# Patient Record
Sex: Female | Born: 2000 | Race: White | Hispanic: No | Marital: Single | Smoking: Never smoker
Health system: Southern US, Community
[De-identification: ages and names within clinical notes are randomized; demographics above are authoritative.]

## PROBLEM LIST (undated history)

## (undated) DIAGNOSIS — L509 Urticaria, unspecified: Secondary | ICD-10-CM

## (undated) DIAGNOSIS — T783XXA Angioneurotic edema, initial encounter: Secondary | ICD-10-CM

## (undated) DIAGNOSIS — K589 Irritable bowel syndrome without diarrhea: Secondary | ICD-10-CM

## (undated) DIAGNOSIS — Z464 Encounter for fitting and adjustment of orthodontic device: Secondary | ICD-10-CM

## (undated) DIAGNOSIS — F429 Obsessive-compulsive disorder, unspecified: Secondary | ICD-10-CM

## (undated) DIAGNOSIS — IMO0001 Reserved for inherently not codable concepts without codable children: Secondary | ICD-10-CM

## (undated) DIAGNOSIS — J45909 Unspecified asthma, uncomplicated: Secondary | ICD-10-CM

## (undated) DIAGNOSIS — H547 Unspecified visual loss: Secondary | ICD-10-CM

## (undated) HISTORY — DX: Unspecified asthma, uncomplicated: J45.909

## (undated) HISTORY — DX: Angioneurotic edema, initial encounter: T78.3XXA

## (undated) HISTORY — DX: Unspecified visual loss: H54.7

## (undated) HISTORY — DX: Reserved for inherently not codable concepts without codable children: IMO0001

## (undated) HISTORY — DX: Encounter for fitting and adjustment of orthodontic device: Z46.4

## (undated) HISTORY — DX: Obsessive-compulsive disorder, unspecified: F42.9

## (undated) HISTORY — DX: Irritable bowel syndrome, unspecified: K58.9

## (undated) HISTORY — DX: Urticaria, unspecified: L50.9

---
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
## 2022-01-02 ENCOUNTER — Telehealth: Payer: Self-pay | Admitting: Adult Health

## 2022-01-02 NOTE — Telephone Encounter (Signed)
Can u print this off and put in my box

## 2022-01-02 NOTE — Telephone Encounter (Signed)
Kristine Donaldson called and said that she had a meeting with DSS rep and they need a couple more things added as an addendum to the letter completed on 12/21/21.   1) Description of more benefits of an emotional support animal.   2) Outline consequences of not being able to get these accomodation's and what would happen mentally if she didn't get the emotional support animal.  Her mom will pick the letter up. Kristine Donaldson's phone number is [phone].

## 2022-01-24 ENCOUNTER — Telehealth: Payer: Self-pay | Admitting: Adult Health

## 2022-01-24 NOTE — Telephone Encounter (Signed)
Spoke with mother.

## 2022-01-24 NOTE — Telephone Encounter (Signed)
Pt's mom Eustaquio Maize called needing to talk to provider about specific details needed in letter form to [place]. She received a letter but school needs more info. Call Beth @ [phone] ASAP

## 2022-01-25 ENCOUNTER — Telehealth: Payer: Self-pay | Admitting: Adult Health

## 2022-01-25 NOTE — Telephone Encounter (Signed)
Traci had called her.

## 2022-01-25 NOTE — Telephone Encounter (Signed)
Patient said that the representative she is dealing with said the content of your letter was fine, but needs to emphasize the limitations and consequences and that these are needs and not wants. I'll put the paperwork back in your box.  ?

## 2022-01-25 NOTE — Telephone Encounter (Signed)
Pt LVM just missed Gina's call. Said she will try to keep phone near her for return call or let he know when a good time to call back to speak to [place]. ?

## 2022-01-27 ENCOUNTER — Telehealth: Payer: Self-pay | Admitting: Adult Health

## 2022-01-27 NOTE — Telephone Encounter (Signed)
Pt called to advise letter for school is ready and Beth (mom) will pick up today.  ?

## 2022-05-18 ENCOUNTER — Ambulatory Visit (INDEPENDENT_AMBULATORY_CARE_PROVIDER_SITE_OTHER): Payer: BC Managed Care – PPO | Admitting: Psychiatry

## 2022-05-18 DIAGNOSIS — F422 Mixed obsessional thoughts and acts: Secondary | ICD-10-CM | POA: Diagnosis not present
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
## 2022-05-23 ENCOUNTER — Ambulatory Visit: Payer: BC Managed Care – PPO | Admitting: Psychiatry

## 2022-06-05 ENCOUNTER — Ambulatory Visit (INDEPENDENT_AMBULATORY_CARE_PROVIDER_SITE_OTHER): Payer: BC Managed Care – PPO | Admitting: Adult Health

## 2022-06-05 ENCOUNTER — Encounter: Payer: Self-pay | Admitting: Adult Health

## 2022-06-05 DIAGNOSIS — F422 Mixed obsessional thoughts and acts: Secondary | ICD-10-CM

## 2022-06-05 DIAGNOSIS — F411 Generalized anxiety disorder: Secondary | ICD-10-CM | POA: Diagnosis not present

## 2022-06-05 DIAGNOSIS — R4588 Nonsuicidal self-harm: Secondary | ICD-10-CM

## 2022-06-05 NOTE — Progress Notes (Signed)
[name] [id number] November 23, 2001 21 y.o.  Subjective:   Patient ID:  Kristine Donaldson is a 21 y.o. (DOB Apr 13, 2001) female.  Chief Complaint: No chief complaint on file.   HPI Kristine Donaldson presents to the office today for follow-up of obsessional thoughts and acts, anxiety and non suicidal self harm.  Describes mood today as "ok". Pleasant. Decreased tearfulness. Mood symptoms - reports some depression, anxiety and irritability. Mood is consistent. Stating "I feel fine". Has completed her first year at [employer] and is home for the summer. Feels like she has adjusted well to college - made good friends. Feels like medications continues to work well. Stable interest and motivation. Taking medications as prescribed.  Energy levels stable. Active, has a regular exercise routine.  Enjoys some usual interests and activities. Single. Student. Lives with parents when not in college - youngest of 4 children .Spending time with family. Appetite adequate. Weight stable. Sleeps better some nights than others. Averages 6 to 8 hours. Focus and concentration stable - "pretty good". Completing tasks. Managing aspects of household. Attending [employer] rising sophomore. Denies SI or HI.  Denies AH or VH. Reports cutting once a few weeks ago - none for months.  Smokes THC daily.  Previous medication trials: Denies       Review of Systems:  Review of Systems  Musculoskeletal:  Negative for gait problem.  Neurological:  Negative for tremors.  Psychiatric/Behavioral:         Please refer to HPI    Medications: I have reviewed the patient's current medications.  Current Outpatient Medications  Medication Sig Dispense Refill   cetirizine (ZYRTEC) 10 MG tablet Take 10 mg by mouth daily.     clindamycin (CLEOCIN) 150 MG capsule Take 300 mg by mouth every 8 (eight) hours.     dicyclomine (BENTYL) 20 MG tablet      fluvoxaMINE (LUVOX) 100 MG tablet Take 2 tablets (200 mg total) by mouth at bedtime. 180 tablet 3    fluvoxaMINE (LUVOX) 25 MG tablet Take 1 tablet (25 mg total) by mouth at bedtime. 90 tablet 3   montelukast (SINGULAIR) 10 MG tablet Take 1 tablet (10 mg total) by mouth at bedtime. 30 tablet 5   propranolol (INDERAL) 10 MG tablet Take 1 tablet (10 mg total) by mouth 2 (two) times daily. 180 tablet 3   No current facility-administered medications for this visit.    Medication Side Effects: None  Allergies:  Allergies  Allergen Reactions   Penicillin G     Other reaction(s): Unknown   Penicillins Rash    Past Medical History:  Diagnosis Date   Angio-edema    Asthma    history   Irritable bowel syndrome    Obsessive-compulsive disorder    Orthodontics    Urticaria    Vision impairment     Past Medical History, Surgical history, Social history, and Family history were reviewed and updated as appropriate.   Please see review of systems for further details on the patient's review from today.   Objective:   Physical Exam:  There were no vitals taken for this visit.  Physical Exam Constitutional:      General: She is not in acute distress. Musculoskeletal:        General: No deformity.  Neurological:     Mental Status: She is alert and oriented to person, place, and time.     Coordination: Coordination normal.  Psychiatric:        Attention and Perception: Attention and perception  normal. She does not perceive auditory or visual hallucinations.        Mood and Affect: Mood normal. Mood is not anxious or depressed. Affect is not labile, blunt, angry or inappropriate.        Speech: Speech normal.        Behavior: Behavior normal.        Thought Content: Thought content normal. Thought content is not paranoid or delusional. Thought content does not include homicidal or suicidal ideation. Thought content does not include homicidal or suicidal plan.        Cognition and Memory: Cognition and memory normal.        Judgment: Judgment normal.     Comments: Insight intact      Lab Review:     Component Value Date/Time   NA 136 03/13/2020 1244   K 4.1 03/13/2020 1244   CL 109 03/13/2020 1244   CO2 20 (L) 03/13/2020 1244   GLUCOSE 89 03/13/2020 1244   BUN 9 03/13/2020 1244   CREATININE 0.71 03/13/2020 1244   CALCIUM 8.7 (L) 03/13/2020 1244   PROT 6.7 03/13/2020 1244   ALBUMIN 3.1 (L) 03/13/2020 1244   AST 16 03/13/2020 1244   ALT 11 03/13/2020 1244   ALKPHOS 59 03/13/2020 1244   BILITOT 0.5 03/13/2020 1244   GFRNONAA NOT CALCULATED 03/13/2020 1244   GFRAA NOT CALCULATED 03/13/2020 1244       Component Value Date/Time   WBC 6.5 03/13/2020 1244   RBC 4.14 03/13/2020 1244   HGB 12.8 03/13/2020 1244   HCT 38.5 03/13/2020 1244   PLT 237 03/13/2020 1244   MCV 93.0 03/13/2020 1244   MCH 30.9 03/13/2020 1244   MCHC 33.2 03/13/2020 1244   RDW 12.0 03/13/2020 1244   LYMPHSABS 1.4 03/13/2020 1244   MONOABS 0.4 03/13/2020 1244   EOSABS 0.1 03/13/2020 1244   BASOSABS 0.0 03/13/2020 1244    No results found for: "POCLITH", "LITHIUM"   No results found for: "PHENYTOIN", "PHENOBARB", "VALPROATE", "CBMZ"   .res Assessment: Plan:    Plan:  PDMP reviewed  1. D/C Luvox '100mg'$  - 2 daily 2. D/C Luvox '25mg'$  to daily regimen 3. Propranolol '10mg'$  BID - plans to use as needed  Set up with therapist Lina Sayre  RTC 6 months - will call if needs medications.  Patient advised to contact office with any questions, adverse effects, or acute worsening in signs and symptoms.  Diagnoses and all orders for this visit:  Mixed obsessional thoughts and acts  Generalized anxiety disorder  Non-suicidal self-harm ([place])     Please see After Visit Summary for patient specific instructions.  Future Appointments  Date Time Provider [place]  06/08/2022  3:00 PM Lina Sayre, [facility] CP-CP None    No orders of the defined types were placed in this encounter.   -------------------------------

## 2022-06-08 ENCOUNTER — Ambulatory Visit (INDEPENDENT_AMBULATORY_CARE_PROVIDER_SITE_OTHER): Payer: BC Managed Care – PPO | Admitting: Psychiatry

## 2022-06-08 DIAGNOSIS — F422 Mixed obsessional thoughts and acts: Secondary | ICD-10-CM
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
## 2022-09-25 ENCOUNTER — Telehealth: Payer: Self-pay | Admitting: Psychiatry

## 2022-09-25 NOTE — Telephone Encounter (Signed)
Tried to call patient due to not being able to get her in for an appointment.  No answer and mailbox was full.

## 2022-11-13 ENCOUNTER — Ambulatory Visit: Payer: BC Managed Care – PPO | Admitting: Psychiatry

## 2022-11-21 ENCOUNTER — Encounter: Payer: Self-pay | Admitting: Plastic Surgery

## 2022-11-21 ENCOUNTER — Ambulatory Visit (INDEPENDENT_AMBULATORY_CARE_PROVIDER_SITE_OTHER): Payer: BC Managed Care – PPO | Admitting: Plastic Surgery

## 2022-11-21 DIAGNOSIS — N62 Hypertrophy of breast: Secondary | ICD-10-CM | POA: Diagnosis not present

## 2022-11-21 NOTE — Progress Notes (Signed)
   Subjective:    Patient ID: Kristine Donaldson, female    DOB: 12/27/2000, 21 y.o.   MRN: [id number]  The patient is a 21 year old female joining me by phone with her mom.  She is interested in a bilateral breast reduction.  However she is in college and does not think that she can get time off within the next 90 days.      Review of Systems  Constitutional: Negative.   Eyes: Negative.   Respiratory: Negative.    Cardiovascular: Negative.   Gastrointestinal: Negative.   Endocrine: Negative.   Genitourinary: Negative.        Objective:   Physical Exam     Assessment & Plan:     ICD-10-CM   1. Juvenile mammary hypertrophy  N62        Patient will call for submission to insurance when she has an idea of when she could have the surgery amidst her schooling.  I connected with  Kristine Donaldson on 11/21/22 by phone and verified that I am speaking with the correct person using two identifiers. Patient was at home and I was at the office.  We spent 5 minutes in discussion.   I discussed the limitations of evaluation and management by telemedicine. The patient expressed understanding and agreed to proceed.

## 2023-01-19 ENCOUNTER — Other Ambulatory Visit: Payer: Self-pay | Admitting: Adult Health

## 2023-01-19 DIAGNOSIS — F422 Mixed obsessional thoughts and acts: Secondary | ICD-10-CM

## 2023-01-21 NOTE — Telephone Encounter (Signed)
Please call to schedule an appt, was due in January.

## 2023-01-26 NOTE — Telephone Encounter (Signed)
Vm full

## 2023-02-20 ENCOUNTER — Telehealth: Payer: Self-pay | Admitting: Adult Health

## 2023-02-20 NOTE — Telephone Encounter (Signed)
We need more details on this.

## 2023-02-20 NOTE — Telephone Encounter (Signed)
Pt called and said that she is still looking for a psychatrist in [place]. She was recently in the hospital and the doctor increased her luvox to 50 mg. Please [facility] in [facility] [place], [place] [address]. Please call her if this can't be done at [phone]

## 2023-02-21 NOTE — Telephone Encounter (Signed)
Mailbox is full, does not have MyChart.

## 2023-02-26 NOTE — Telephone Encounter (Signed)
Called again and mailbox is full

## 2023-04-11 ENCOUNTER — Encounter: Payer: Self-pay | Admitting: Psychiatry

## 2023-04-11 ENCOUNTER — Ambulatory Visit (INDEPENDENT_AMBULATORY_CARE_PROVIDER_SITE_OTHER): Payer: BC Managed Care – PPO | Admitting: Psychiatry

## 2023-04-11 DIAGNOSIS — F422 Mixed obsessional thoughts and acts: Secondary | ICD-10-CM
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
## 2023-05-11 ENCOUNTER — Ambulatory Visit (INDEPENDENT_AMBULATORY_CARE_PROVIDER_SITE_OTHER): Payer: BC Managed Care – PPO | Admitting: Adult Health

## 2023-05-11 ENCOUNTER — Encounter: Payer: Self-pay | Admitting: Adult Health

## 2023-05-11 DIAGNOSIS — F422 Mixed obsessional thoughts and acts: Secondary | ICD-10-CM

## 2023-05-11 DIAGNOSIS — R4588 Nonsuicidal self-harm: Secondary | ICD-10-CM

## 2023-05-11 DIAGNOSIS — F411 Generalized anxiety disorder: Secondary | ICD-10-CM | POA: Diagnosis not present

## 2023-05-11 NOTE — Progress Notes (Signed)
Kristine Donaldson [id number] [birth year] 22 y.o.  Subjective:   Patient ID:  Kristine Donaldson is a 22 y.o. (DOB 24-Mar-2001) female.  Chief Complaint: No chief complaint on file.   HPI Kristine Donaldson presents to the office today for follow-up of obsessional thoughts and acts, anxiety and non suicidal self harm.  Describes mood today as "ok". Pleasant. Decreased tearfulness. Mood symptoms - denies depression and irritability. Reports anxiety at times. Mood is consistent. Stating "I feel like I'm doing alright for now". Reports she stopped medication for a period of time and restarted at 25mg  daily. Was then hospitalized in February for suicidal thoughts. Reports taking Luvox 100mg  daily consistently. Patient also wanting to discuss a BPD diagnosis. Discussed cutting history - started at age 28. Plans to continue therapy - seek DBT. Has met with Stevphen Meuse recently. Has completed her second year at [employer] and is home for the summer. Stable interest and motivation. Taking medications as prescribed.  Energy levels stable. Active, has a regular exercise routine.  Enjoys some usual interests and activities. Single. Student. Lives with parents when not in college - youngest of 4 children .Spending time with family. Appetite adequate. Weight stable. Sleeps better some nights than others. Averages 8 to 9 hours of broken sleep. Focus and concentration stable - Completing tasks. Managing aspects of household. 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  Denies AH or VH. Reports cutting - 2 weeks ago. Smokes THC daily.  Previous medication trials: Denies   Review of Systems:  Review of Systems  Musculoskeletal:  Negative for gait problem.  Neurological:  Negative for tremors.  Psychiatric/Behavioral:         Please refer to HPI    Medications: I have reviewed the patient's current medications.  Current Outpatient Medications  Medication Sig Dispense Refill   cetirizine (ZYRTEC) 10 MG tablet Take  10 mg by mouth daily.     clindamycin (CLEOCIN) 150 MG capsule Take 300 mg by mouth every 8 (eight) hours.     dicyclomine (BENTYL) 20 MG tablet      fluvoxaMINE (LUVOX) 100 MG tablet Take 2 tablets (200 mg total) by mouth at bedtime. 180 tablet 3   fluvoxaMINE (LUVOX) 25 MG tablet Take 1 tablet (25 mg total) by mouth at bedtime. 90 tablet 3   montelukast (SINGULAIR) 10 MG tablet Take 1 tablet (10 mg total) by mouth at bedtime. 30 tablet 5   propranolol (INDERAL) 10 MG tablet Take 1 tablet (10 mg total) by mouth 2 (two) times daily. 180 tablet 3   No current facility-administered medications for this visit.    Medication Side Effects: None  Allergies:  Allergies  Allergen Reactions   Penicillin G     Other reaction(s): Unknown Other reaction(s): Unknown   Penicillins Rash    Past Medical History:  Diagnosis Date   Angio-edema    Asthma    history   Irritable bowel syndrome    Obsessive-compulsive disorder    Orthodontics    Urticaria    Vision impairment     Past Medical History, Surgical history, Social history, and Family history were reviewed and updated as appropriate.   Please see review of systems for further details on the patient's review from today.   Objective:   Physical Exam:  There were no vitals taken for this visit.  Physical Exam Constitutional:      General: She is not in acute distress. Musculoskeletal:        General:  No deformity.  Neurological:     Mental Status: She is alert and oriented to person, place, and time.     Coordination: Coordination normal.  Psychiatric:        Attention and Perception: Attention and perception normal. She does not perceive auditory or visual hallucinations.        Mood and Affect: Mood normal. Mood is not anxious or depressed. Affect is not labile, blunt, angry or inappropriate.        Speech: Speech normal.        Behavior: Behavior normal.        Thought Content: Thought content normal. Thought content is  not paranoid or delusional. Thought content does not include homicidal or suicidal ideation. Thought content does not include homicidal or suicidal plan.        Cognition and Memory: Cognition and memory normal.        Judgment: Judgment normal.     Comments: Insight intact     Lab Review:     Component Value Date/Time   NA 136 03/13/2020 1244   K 4.1 03/13/2020 1244   CL 109 03/13/2020 1244   CO2 20 (L) 03/13/2020 1244   GLUCOSE 89 03/13/2020 1244   BUN 9 03/13/2020 1244   CREATININE 0.71 03/13/2020 1244   CALCIUM 8.7 (L) 03/13/2020 1244   PROT 6.7 03/13/2020 1244   ALBUMIN 3.1 (L) 03/13/2020 1244   AST 16 03/13/2020 1244   ALT 11 03/13/2020 1244   ALKPHOS 59 03/13/2020 1244   BILITOT 0.5 03/13/2020 1244   GFRNONAA NOT CALCULATED 03/13/2020 1244   GFRAA NOT CALCULATED 03/13/2020 1244       Component Value Date/Time   WBC 6.5 03/13/2020 1244   RBC 4.14 03/13/2020 1244   HGB 12.8 03/13/2020 1244   HCT 38.5 03/13/2020 1244   PLT 237 03/13/2020 1244   MCV 93.0 03/13/2020 1244   MCH 30.9 03/13/2020 1244   MCHC 33.2 03/13/2020 1244   RDW 12.0 03/13/2020 1244   LYMPHSABS 1.4 03/13/2020 1244   MONOABS 0.4 03/13/2020 1244   EOSABS 0.1 03/13/2020 1244   BASOSABS 0.0 03/13/2020 1244    No results found for: "POCLITH", "LITHIUM"   No results found for: "PHENYTOIN", "PHENOBARB", "VALPROATE", "CBMZ"   .res Assessment: Plan:    Plan:  PDMP reviewed  Luvox 100mg  daily  Propranolol 10mg  BID - uses infrequently   Seeing therapist Stevphen Meuse  Also seeing a therapist and psychiatrist at school.  Refer to CPA  RTC 6 months - will call if needs medications.  Patient advised to contact office with any questions, adverse effects, or acute worsening in signs and symptoms.  There are no diagnoses linked to this encounter.   Please see After Visit Summary for patient specific instructions.  Future Appointments  Date Time Provider Department Center  05/11/2023 10:20  AM Briton Sellman, There[facility] Solo, NP CP-CP None  06/28/2023 11:00 AM Stevphen Meuse, [facility] CP-CP None    No orders of the defined types were placed in this encounter.   -------------------------------

## 2023-06-25 ENCOUNTER — Telehealth: Payer: Self-pay | Admitting: Adult Health

## 2023-06-25 DIAGNOSIS — F422 Mixed obsessional thoughts and acts: Secondary | ICD-10-CM

## 2023-06-25 MED ORDER — FLUVOXAMINE MALEATE 100 MG PO TABS: 100.0000 mg | ORAL_TABLET | Freq: Every day | ORAL | 0 refills | Status: DC

## 2023-06-25 NOTE — Telephone Encounter (Signed)
Sent!

## 2023-06-25 NOTE — Telephone Encounter (Signed)
Pt called asking for a new script of her luvox 100 mg. She only takes one at bedtime. Almira Coaster is aware of this change. [facility] [address]

## 2023-06-28 ENCOUNTER — Ambulatory Visit: Payer: BC Managed Care – PPO | Admitting: Psychiatry

## 2023-09-15 ENCOUNTER — Other Ambulatory Visit: Payer: Self-pay | Admitting: Adult Health

## 2023-09-15 DIAGNOSIS — F422 Mixed obsessional thoughts and acts: Secondary | ICD-10-CM

## 2023-10-18 ENCOUNTER — Other Ambulatory Visit: Payer: Self-pay | Admitting: Adult Health

## 2023-10-18 DIAGNOSIS — F422 Mixed obsessional thoughts and acts: Secondary | ICD-10-CM

## 2023-11-22 ENCOUNTER — Other Ambulatory Visit: Payer: Self-pay | Admitting: Adult Health

## 2023-11-22 DIAGNOSIS — F422 Mixed obsessional thoughts and acts: Secondary | ICD-10-CM

## 2023-11-23 NOTE — Telephone Encounter (Signed)
Please schedule pt an appt. LV 06/14 and canceled 8/1

## 2023-11-23 NOTE — Telephone Encounter (Signed)
Lvm for pt to call and schedule

## 2023-11-30 ENCOUNTER — Ambulatory Visit: Payer: 59 | Admitting: Adult Health

## 2023-11-30 ENCOUNTER — Encounter: Payer: Self-pay | Admitting: Adult Health

## 2023-11-30 DIAGNOSIS — F411 Generalized anxiety disorder: Secondary | ICD-10-CM

## 2023-11-30 DIAGNOSIS — F422 Mixed obsessional thoughts and acts: Secondary | ICD-10-CM | POA: Diagnosis not present

## 2023-11-30 DIAGNOSIS — R4588 Nonsuicidal self-harm: Secondary | ICD-10-CM | POA: Diagnosis not present

## 2023-11-30 MED ORDER — FLUVOXAMINE MALEATE 100 MG PO TABS: 100.0000 mg | ORAL_TABLET | Freq: Every day | ORAL | 5 refills | Status: DC

## 2023-11-30 NOTE — Progress Notes (Signed)
 Kristine Donaldson [id number] 09/01/2001 23 y.o.  Subjective:   Patient ID:  Kristine Donaldson is a 23 y.o. (DOB 06/29/2001) female.  Chief Complaint: No chief complaint on file.   HPI Kristine Donaldson presents to the office today for follow-up of obsessional thoughts and acts, anxiety and non suicidal self harm.  Describes mood today as ok. Pleasant. Decreased tearfulness. Mood symptoms - denies depression, anxiety, or irritability. Denies panic attacks. Reports some worry, rumination, and over thinking. Mood is stable. Stating I feel like I'm doing ok. Reports taking Luvox  100mg  daily consistently and feels it is helpful. Stable interest and motivation. Taking medications as prescribed.  Energy levels stable. Active, has a regular exercise routine.  Enjoys some usual interests and activities. Single. Student. Lives with parents when not in college - youngest of 4 children. Spending time with family. Appetite adequate. Weight stable. Sleeps better some nights than others. Averages 8 or more hours. Focus and concentration stable - Completing tasks. Managing aspects of household. 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  Denies AH or VH. Denies self harm. Smokes THC occasionally.  Previous medication trials: Denies      Review of Systems:  Review of Systems  Musculoskeletal:  Negative for gait problem.  Neurological:  Negative for tremors.  Psychiatric/Behavioral:         Please refer to HPI    Medications: I have reviewed the patient's current medications.  Current Outpatient Medications  Medication Sig Dispense Refill   cetirizine (ZYRTEC) 10 MG tablet Take 10 mg by mouth daily.     clindamycin (CLEOCIN) 150 MG capsule Take 300 mg by mouth every 8 (eight) hours.     dicyclomine (BENTYL) 20 MG tablet      fluvoxaMINE  (LUVOX ) 100 MG tablet TAKE 1 TABLET BY MOUTH EVERYDAY AT BEDTIME 30 tablet 0   fluvoxaMINE  (LUVOX ) 25 MG tablet Take 1 tablet (25 mg total) by mouth at  bedtime. 90 tablet 3   montelukast  (SINGULAIR ) 10 MG tablet Take 1 tablet (10 mg total) by mouth at bedtime. 30 tablet 5   propranolol  (INDERAL ) 10 MG tablet Take 1 tablet (10 mg total) by mouth 2 (two) times daily. 180 tablet 3   No current facility-administered medications for this visit.    Medication Side Effects: None  Allergies:  Allergies  Allergen Reactions   Penicillin G     Other reaction(s): Unknown Other reaction(s): Unknown   Penicillins Rash    Past Medical History:  Diagnosis Date   Angio-edema    Asthma    history   Irritable bowel syndrome    Obsessive-compulsive disorder    Orthodontics    Urticaria    Vision impairment     Past Medical History, Surgical history, Social history, and Family history were reviewed and updated as appropriate.   Please see review of systems for further details on the patient's review from today.   Objective:   Physical Exam:  There were no vitals taken for this visit.  Physical Exam Constitutional:      General: She is not in acute distress. Musculoskeletal:        General: No deformity.  Neurological:     Mental Status: She is alert and oriented to person, place, and time.     Coordination: Coordination normal.  Psychiatric:        Attention and Perception: Attention and perception normal. She does not perceive auditory or visual hallucinations.        Mood  and Affect: Mood normal. Mood is not anxious or depressed. Affect is not labile, blunt, angry or inappropriate.        Speech: Speech normal.        Behavior: Behavior normal.        Thought Content: Thought content normal. Thought content is not paranoid or delusional. Thought content does not include homicidal or suicidal ideation. Thought content does not include homicidal or suicidal plan.        Cognition and Memory: Cognition and memory normal.        Judgment: Judgment normal.     Comments: Insight intact     Lab Review:     Component Value  Date/Time   NA 136 03/13/2020 1244   K 4.1 03/13/2020 1244   CL 109 03/13/2020 1244   CO2 20 (L) 03/13/2020 1244   GLUCOSE 89 03/13/2020 1244   BUN 9 03/13/2020 1244   CREATININE 0.71 03/13/2020 1244   CALCIUM 8.7 (L) 03/13/2020 1244   PROT 6.7 03/13/2020 1244   ALBUMIN 3.1 (L) 03/13/2020 1244   AST 16 03/13/2020 1244   ALT 11 03/13/2020 1244   ALKPHOS 59 03/13/2020 1244   BILITOT 0.5 03/13/2020 1244   GFRNONAA NOT CALCULATED 03/13/2020 1244   GFRAA NOT CALCULATED 03/13/2020 1244       Component Value Date/Time   WBC 6.5 03/13/2020 1244   RBC 4.14 03/13/2020 1244   HGB 12.8 03/13/2020 1244   HCT 38.5 03/13/2020 1244   PLT 237 03/13/2020 1244   MCV 93.0 03/13/2020 1244   MCH 30.9 03/13/2020 1244   MCHC 33.2 03/13/2020 1244   RDW 12.0 03/13/2020 1244   LYMPHSABS 1.4 03/13/2020 1244   MONOABS 0.4 03/13/2020 1244   EOSABS 0.1 03/13/2020 1244   BASOSABS 0.0 03/13/2020 1244    No results found for: POCLITH, LITHIUM   No results found for: PHENYTOIN, PHENOBARB, VALPROATE, CBMZ   .res Assessment: Plan:    Plan:  PDMP reviewed  Luvox  100mg  daily  Propranolol  10mg  BID - uses infrequently   Seeing therapist Silvano Pacini  Also seeing a therapist and psychiatrist at school.  Refer to [facility]  RTC 6 months - will call if needs medications.  Patient advised to contact office with any questions, adverse effects, or acute worsening in signs and symptoms. There are no diagnoses linked to this encounter.   Please see After Visit Summary for patient specific instructions.  Future Appointments  Date Time Provider Department Center  11/30/2023  1:00 PM Lynzee Lindquist Nattalie, NP [facility] None    No orders of the defined types were placed in this encounter.   -------------------------------

## 2023-12-25 ENCOUNTER — Other Ambulatory Visit: Payer: Self-pay | Admitting: Adult Health

## 2023-12-25 DIAGNOSIS — F422 Mixed obsessional thoughts and acts: Secondary | ICD-10-CM

## 2024-05-22 ENCOUNTER — Telehealth: Payer: Self-pay | Admitting: Adult Health

## 2024-05-22 NOTE — Telephone Encounter (Signed)
 Pt has RF at the requested pharmacy, notified her.

## 2024-05-22 NOTE — Telephone Encounter (Signed)
 Patient called in for refill on Luvox  100mg . Ph: [phone] appt 7/2. Pharmacy [facility] [address] [place]

## 2024-05-28 ENCOUNTER — Encounter: Payer: Self-pay | Admitting: Adult Health

## 2024-05-28 ENCOUNTER — Telehealth: Admitting: Adult Health

## 2024-05-28 DIAGNOSIS — F422 Mixed obsessional thoughts and acts: Secondary | ICD-10-CM

## 2024-05-28 DIAGNOSIS — R4588 Nonsuicidal self-harm: Secondary | ICD-10-CM | POA: Diagnosis not present

## 2024-05-28 DIAGNOSIS — F411 Generalized anxiety disorder: Secondary | ICD-10-CM | POA: Diagnosis not present

## 2024-05-28 MED ORDER — FLUVOXAMINE MALEATE 100 MG PO TABS: 100.0000 mg | ORAL_TABLET | Freq: Every day | ORAL | 0 refills | Status: DC

## 2024-05-28 NOTE — Progress Notes (Signed)
 Kristine Donaldson [id number] Dec 21, 2000 23 y.o.  Virtual Visit via Video Note  I connected with pt @ on 05/28/24 at  8:00 AM EDT by a video enabled telemedicine application and verified that I am speaking with the correct person using two identifiers.   I discussed the limitations of evaluation and management by telemedicine and the availability of in person appointments. The Kristine Donaldson expressed understanding and agreed to proceed.  I discussed the assessment and treatment plan with the Kristine Donaldson. The Kristine Donaldson was provided an opportunity to ask questions and all were answered. The Kristine Donaldson agreed with the plan and demonstrated an understanding of the instructions.   The Kristine Donaldson was advised to call back or seek an in-person evaluation if the symptoms worsen or if the condition fails to improve as anticipated.  I provided 15 minutes of non-face-to-face time during this encounter.  The Kristine Donaldson was located at home.  The provider was located at [facility].   Angeline LOISE Sayers, NP   Subjective:   Kristine Donaldson ID:  Kristine Donaldson is a 23 y.o. (DOB 03-01-2001) female.  Chief Complaint: No chief complaint on file.   HPI Kristine Donaldson presents for follow-up of obsessional thoughts and acts, anxiety and non suicidal self harm.  A little anxiety A little sad bc of friends  Describes mood today as ok. Pleasant. Denies tearfulness. Mood symptoms - denies depression - a little sad because I'm missing my friends. Reports stable interest and motivation. Reports some anxiety - situational. Denies irritability. Denies panic attacks. Denies worry, rumination, and over thinking. Reports mood is stable. Stating I feel like I'm doing good. Reports taking Luvox  100mg  daily and feels it is helpful. Taking medications as prescribed.  Energy levels stable. Active, has a regular exercise routine.  Enjoys some usual interests and activities. Single. Student. Lives with parents when not in college - youngest of 4  children. Spending time with family. Appetite adequate. Weight stable. Sleeps better some nights than others. Averages 8 or more hours. Focus and concentration stable - Completing tasks. Managing aspects of household. 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 Denies SI or HI.  Denies AH or VH. Denies self harm. Denies substance use.  Previous medication trials: Denies  Review of Systems:  Review of Systems  Musculoskeletal:  Negative for gait problem.  Neurological:  Negative for tremors.  Psychiatric/Behavioral:         Please refer to HPI    Medications: I have reviewed the Kristine Donaldson's current medications.  Current Outpatient Medications  Medication Sig Dispense Refill   cetirizine (ZYRTEC) 10 MG tablet Take 10 mg by mouth daily.     clindamycin (CLEOCIN) 150 MG capsule Take 300 mg by mouth every 8 (eight) hours.     dicyclomine (BENTYL) 20 MG tablet      fluvoxaMINE  (LUVOX ) 100 MG tablet TAKE 1 TABLET BY MOUTH EVERY DAY 90 tablet 2   montelukast  (SINGULAIR ) 10 MG tablet Take 1 tablet (10 mg total) by mouth at bedtime. 30 tablet 5   propranolol  (INDERAL ) 10 MG tablet Take 1 tablet (10 mg total) by mouth 2 (two) times daily. 180 tablet 3   No current facility-administered medications for this visit.    Medication Side Effects: None  Allergies:  Allergies  Allergen Reactions   Penicillin G     Other reaction(s): Unknown Other reaction(s): Unknown   Penicillins Rash    Past Medical History:  Diagnosis Date   Angio-edema  Asthma    history   Irritable bowel syndrome    Obsessive-compulsive disorder    Orthodontics    Urticaria    Vision impairment     Family History  Problem Relation Age of Onset   Asthma Brother    Allergic rhinitis Brother    OCD Brother    Thyroid disease Maternal Grandmother    Fibromyalgia Maternal Grandmother    Arthritis Maternal Grandmother    Diabetes Maternal Grandmother     Depression Maternal Grandmother    Melanoma Maternal Grandfather    OCD Maternal Grandfather    Hypertension Paternal Grandmother    Prostate cancer Paternal Grandfather    OCD Mother    Depression Sister     Social History   Socioeconomic History   Marital status: Single    Spouse name: Not on file   Number of children: Not on file   Years of education: Not on file   Highest education level: 10th grade  Occupational History   Occupation: Consulting civil engineer  Tobacco Use   Smoking status: Never   Smokeless tobacco: Never  Vaping Use   Vaping status: Never Used  Substance and Sexual Activity   Alcohol use: Never   Drug use: Never   Sexual activity: Not on file  Other Topics Concern   Not on file  Social History Narrative   09/15/2019 GI consultant in the [place] office of [facility] recommended stress management and coping skills counseling.  Kristine Donaldson is obsessively interested in another therapist having made limited progress with Edsel Gee, LCSW at [facility] after [facility], LCSW.  Kristine Donaldson knows Kristine Donaldson has OCD from these 2 therapist and presents today for medication, while wondering herself if Kristine Donaldson has borderline personality disorder or bipolar disorder from her own research.  Mother as a school counselor attempts to help the Kristine Donaldson focus on what can help her while the Kristine Donaldson describes multiple rituals and obsessions though that of picking her nails is improved.  Puberty occurred at start of 6th grade. Kristine Donaldson previously ran track and cross-country non-competitively with the school team.  Kristine Donaldson is nice to all while wondering if Kristine Donaldson has a personality problem or is not happy.  Kristine Donaldson has difficulty swallowing split tablets that have chalky quality as Kristine Donaldson cannot tolerate the taste.   Social Drivers of Corporate investment banker Strain: Low Risk  (10/16/2019)   Overall Financial Resource Strain (CARDIA)    Difficulty of Paying Living Expenses: Not hard at all  Food Insecurity: No Food  Insecurity (10/16/2019)   Hunger Vital Sign    Worried About Running Out of Food in the Last Year: Never true    Ran Out of Food in the Last Year: Never true  Transportation Needs: No Transportation Needs (10/16/2019)   PRAPARE - Administrator, Civil Service (Medical): No    Lack of Transportation (Non-Medical): No  Physical Activity: Not on file  Stress: Stress Concern Present (10/16/2019)   Kristine Donaldson of Occupational Health - Occupational Stress Questionnaire    Feeling of Stress : To some extent  Social Connections: Not on file  Intimate Partner Violence: Not on file    Past Medical History, Surgical history, Social history, and Family history were reviewed and updated as appropriate.   Please see review of systems for further details on the Kristine Donaldson's review from today.   Objective:   Physical Exam:  There were no vitals taken for this visit.  Physical Exam Constitutional:  General: Kristine Donaldson is not in acute distress. Musculoskeletal:        General: No deformity.  Neurological:     Mental Status: Kristine Donaldson is alert and oriented to person, place, and time.     Coordination: Coordination normal.  Psychiatric:        Attention and Perception: Attention and perception normal. Kristine Donaldson does not perceive auditory or visual hallucinations.        Mood and Affect: Mood normal. Mood is not anxious or depressed. Affect is not labile, blunt, angry or inappropriate.        Speech: Speech normal.        Behavior: Behavior normal.        Thought Content: Thought content normal. Thought content is not paranoid or delusional. Thought content does not include homicidal or suicidal ideation. Thought content does not include homicidal or suicidal plan.        Cognition and Memory: Cognition and memory normal.        Judgment: Judgment normal.     Comments: Insight intact     Lab Review:     Component Value Date/Time   NA 136 03/13/2020 1244   K 4.1 03/13/2020 1244   CL 109  03/13/2020 1244   CO2 20 (L) 03/13/2020 1244   GLUCOSE 89 03/13/2020 1244   BUN 9 03/13/2020 1244   CREATININE 0.71 03/13/2020 1244   CALCIUM 8.7 (L) 03/13/2020 1244   PROT 6.7 03/13/2020 1244   ALBUMIN 3.1 (L) 03/13/2020 1244   AST 16 03/13/2020 1244   ALT 11 03/13/2020 1244   ALKPHOS 59 03/13/2020 1244   BILITOT 0.5 03/13/2020 1244   GFRNONAA NOT CALCULATED 03/13/2020 1244   GFRAA NOT CALCULATED 03/13/2020 1244       Component Value Date/Time   WBC 6.5 03/13/2020 1244   RBC 4.14 03/13/2020 1244   HGB 12.8 03/13/2020 1244   HCT 38.5 03/13/2020 1244   PLT 237 03/13/2020 1244   MCV 93.0 03/13/2020 1244   MCH 30.9 03/13/2020 1244   MCHC 33.2 03/13/2020 1244   RDW 12.0 03/13/2020 1244   LYMPHSABS 1.4 03/13/2020 1244   MONOABS 0.4 03/13/2020 1244   EOSABS 0.1 03/13/2020 1244   BASOSABS 0.0 03/13/2020 1244    No results found for: POCLITH, LITHIUM   No results found for: PHENYTOIN, PHENOBARB, VALPROATE, CBMZ   .res Assessment: Plan:    Plan:  PDMP reviewed  Luvox  100mg  daily  Propranolol  10mg  BID - uses infrequently   Seeing therapist Silvano Pacini  Also seeing a therapist and psychiatrist at school.  RTC 6 months - will call if needs medications.  15 minutes spent dedicated to the care of this Kristine Donaldson on the date of this encounter to include pre-visit review of records, ordering of medication, post visit documentation, and face-to-face time with the Kristine Donaldson discussing obsessional thoughts and acts, anxiety and non suicidal self harm. Discussed continuing current medication regimen.  Kristine Donaldson advised to contact office with any questions, adverse effects, or acute worsening in signs and symptoms.  There are no diagnoses linked to this encounter.   Please see After Visit Summary for Kristine Donaldson specific instructions.  Future Appointments  Date Time Provider Department Center  05/28/2024  8:00 AM Reichen Hutzler Nattalie, NP [facility] None    No orders of  the defined types were placed in this encounter.     -------------------------------

## 2024-12-11 ENCOUNTER — Other Ambulatory Visit: Payer: Self-pay | Admitting: Adult Health

## 2024-12-11 DIAGNOSIS — F422 Mixed obsessional thoughts and acts: Secondary | ICD-10-CM

## 2024-12-15 NOTE — Telephone Encounter (Signed)
 Verify pharmacy. Due for FU.

## 2024-12-15 NOTE — Telephone Encounter (Signed)
 Per patient pharmacy is correct. Let her know that she is due for FU and needs to be in [place].
